# Patient Record
Sex: Male | Born: 2000 | Race: Black or African American | Hispanic: No | Marital: Single | Smoking: Never smoker
Health system: Southern US, Community
[De-identification: ages and names within clinical notes are randomized; demographics above are authoritative.]

## PROBLEM LIST (undated history)

## (undated) DIAGNOSIS — F329 Major depressive disorder, single episode, unspecified: Secondary | ICD-10-CM

## (undated) DIAGNOSIS — R519 Headache, unspecified: Secondary | ICD-10-CM

## (undated) DIAGNOSIS — F419 Anxiety disorder, unspecified: Secondary | ICD-10-CM

## (undated) DIAGNOSIS — R51 Headache: Secondary | ICD-10-CM

## (undated) DIAGNOSIS — F32A Depression, unspecified: Secondary | ICD-10-CM

## (undated) DIAGNOSIS — F909 Attention-deficit hyperactivity disorder, unspecified type: Secondary | ICD-10-CM

## (undated) DIAGNOSIS — J45909 Unspecified asthma, uncomplicated: Secondary | ICD-10-CM

## (undated) HISTORY — PX: CIRCUMCISION: SUR203

---
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
## 2021-10-14 ENCOUNTER — Other Ambulatory Visit: Payer: Self-pay | Admitting: Allergy & Immunology
